# Patient Record
Sex: Female | Born: 1995 | Race: Black or African American | Hispanic: No | Marital: Single | Smoking: Never smoker
Health system: Southern US, Community
[De-identification: ages and names within clinical notes are randomized; demographics above are authoritative.]

## PROBLEM LIST (undated history)

## (undated) HISTORY — PX: BREAST BIOPSY: SHX20

## (undated) HISTORY — PX: BREAST EXCISIONAL BIOPSY: SUR124

---
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
## 2017-04-06 IMAGING — CR DG KNEE COMPLETE 4+V*L*
4 series · 4 of 4 positions shown · non-contrast
Comparison: None.

CLINICAL DATA: Status post fall from skateboard, with left knee
pain and bruising. Initial encounter.

EXAM:
LEFT KNEE - COMPLETE 4+ VIEW

[knee ap]
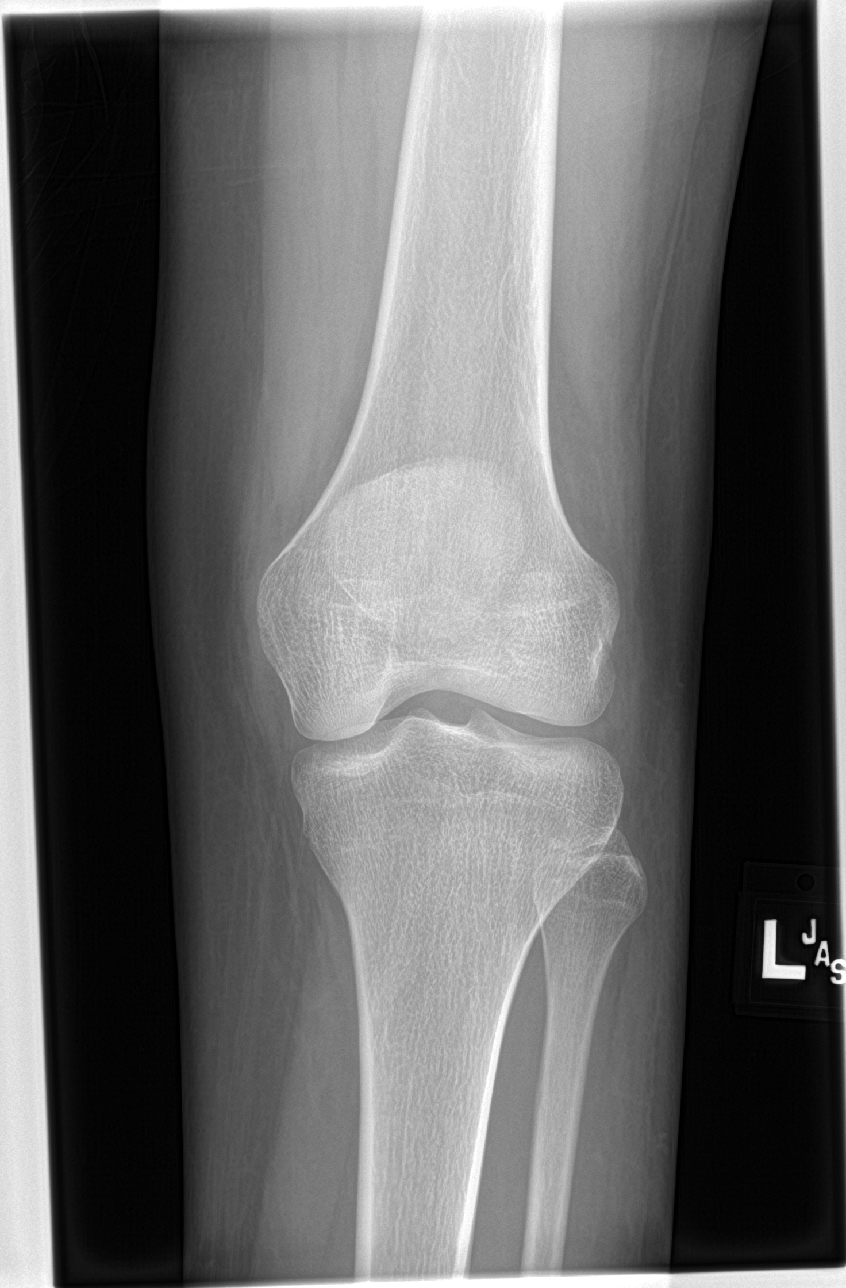

[knee lat]
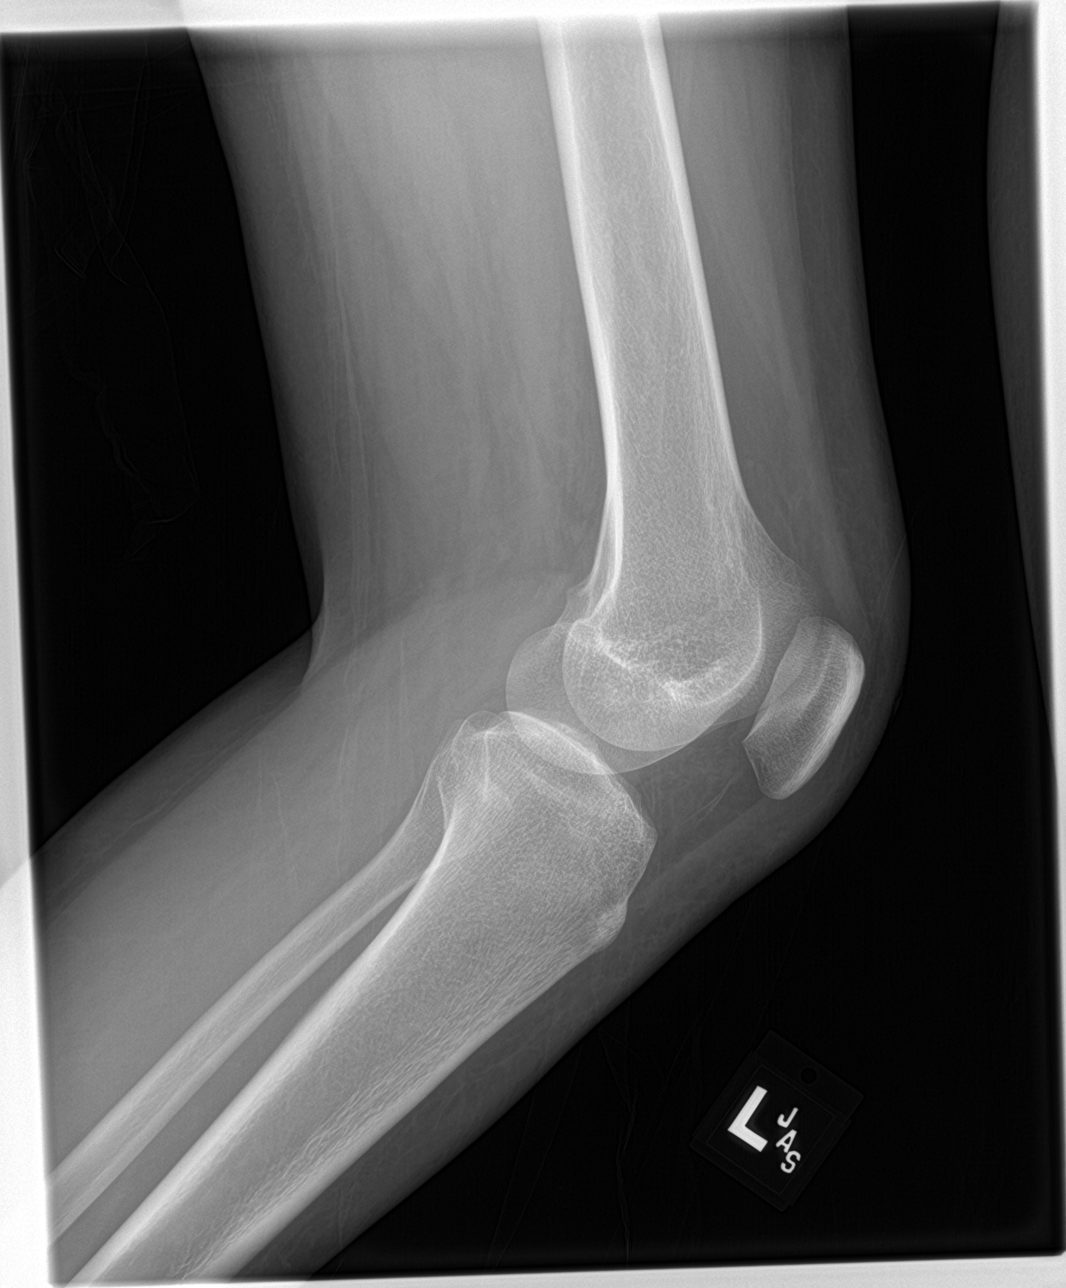

[knee obl (1 of 2)]
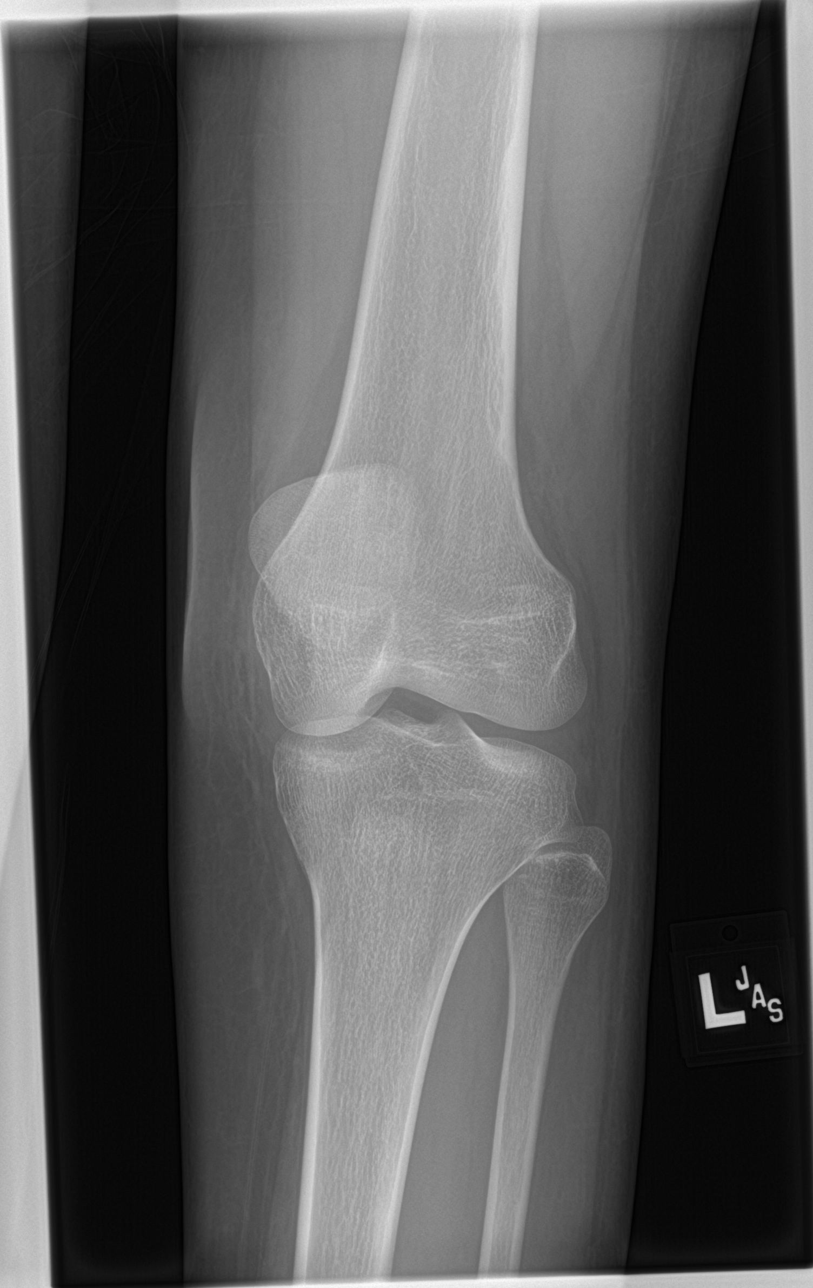

[knee obl (2 of 2)]
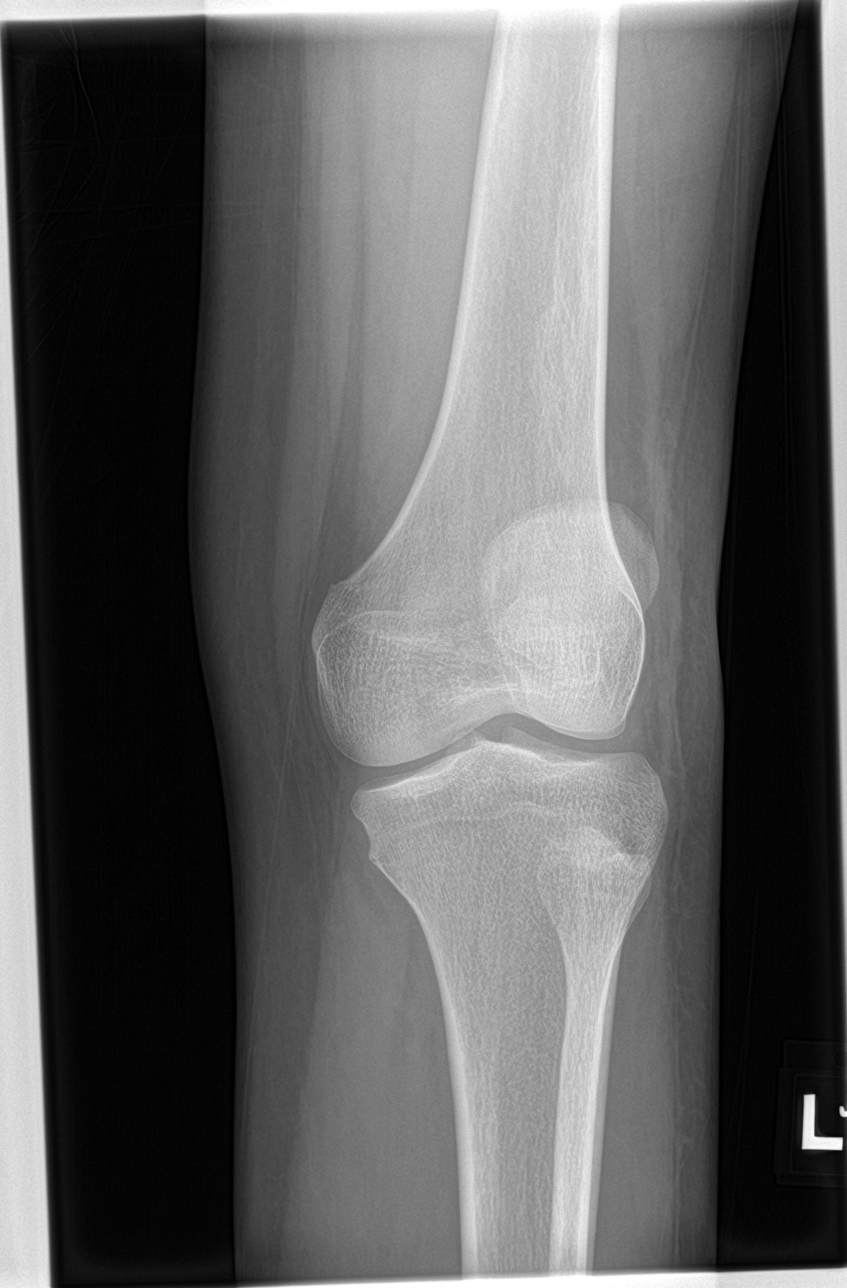

[4 of 4 positions shown; findings below may reference images not displayed]

FINDINGS: There is no evidence of fracture or dislocation. The joint spaces
are preserved. No significant degenerative change is seen; the
patellofemoral joint is grossly unremarkable in appearance.

No significant joint effusion is seen. The visualized soft tissues
are normal in appearance.
IMPRESSION: No evidence of fracture or dislocation.

## 2017-07-27 ENCOUNTER — Other Ambulatory Visit: Payer: Self-pay | Admitting: Nurse Practitioner

## 2017-07-27 DIAGNOSIS — N6320 Unspecified lump in the left breast, unspecified quadrant: Secondary | ICD-10-CM

## 2017-08-05 ENCOUNTER — Other Ambulatory Visit: Payer: BLUE CROSS/BLUE SHIELD

## 2017-08-19 ENCOUNTER — Other Ambulatory Visit: Payer: BLUE CROSS/BLUE SHIELD

## 2017-09-07 ENCOUNTER — Other Ambulatory Visit: Payer: BLUE CROSS/BLUE SHIELD

## 2017-09-07 ENCOUNTER — Ambulatory Visit
Admission: RE | Admit: 2017-09-07 | Discharge: 2017-09-07 | Disposition: A | Discharge status: Home / Self Care | Payer: BLUE CROSS/BLUE SHIELD | Source: Ambulatory Visit | Attending: Nurse Practitioner | Admitting: Nurse Practitioner

## 2017-09-07 ENCOUNTER — Other Ambulatory Visit: Payer: Self-pay | Admitting: Nurse Practitioner

## 2017-09-07 DIAGNOSIS — N6320 Unspecified lump in the left breast, unspecified quadrant: Secondary | ICD-10-CM

## 2018-03-15 ENCOUNTER — Other Ambulatory Visit: Payer: BLUE CROSS/BLUE SHIELD

## 2018-04-02 IMAGING — US ULTRASOUND LEFT BREAST LIMITED
1 series · 13 of 18 positions shown · non-contrast
Comparison: None available at this time.

ADDENDUM:
Comparison is made with previous exam performed 07/05/2015 at
[REDACTED]. In the exam, a parallel oval hypoechoic mass in
the 7 o'clock location of the left breast 6 cm from nipple measured
3.0 x 1.4 x 2.0 cm. Based on the appearance, this likely represents
the same lesion described on current study in the 530 o'clock
location of the left breast. However, given the slight differences
in described location and size, as well as the 2 additional lesions,
I would recommend a follow-up ultrasound in 6 months.

BI-RADS 3: Probably benign.
CLINICAL DATA: History of excisional biopsy of a right breast
fibroadenoma in 1074. Patient has a lump in the lower outer quadrant
of the left breast, first noticed approximately 2 years ago. Mass
feels bigger to the patient and her physician. Patient gives a
history of evaluation of this region in [place] approximately 2
years ago.
EXAM:
ULTRASOUND OF THE LEFT BREAST

[Series 1: ultrasound left breast limited · 0.06mm/px · 13 of 18 slices shown]
[im 1/18]
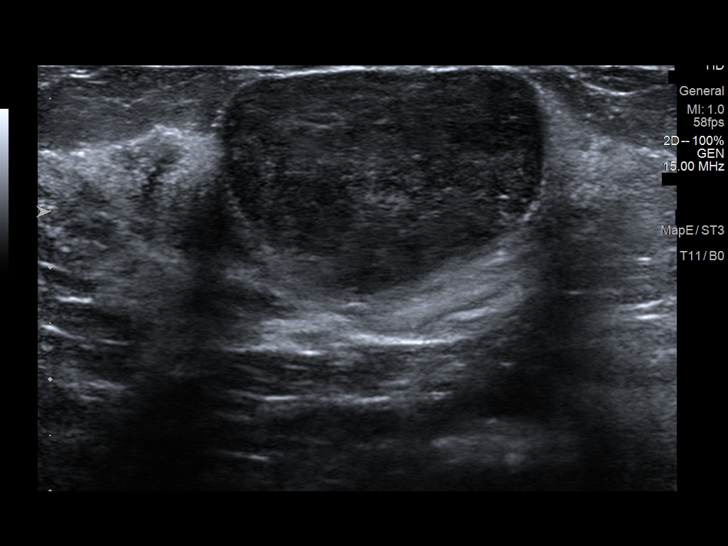
[im 3/18]
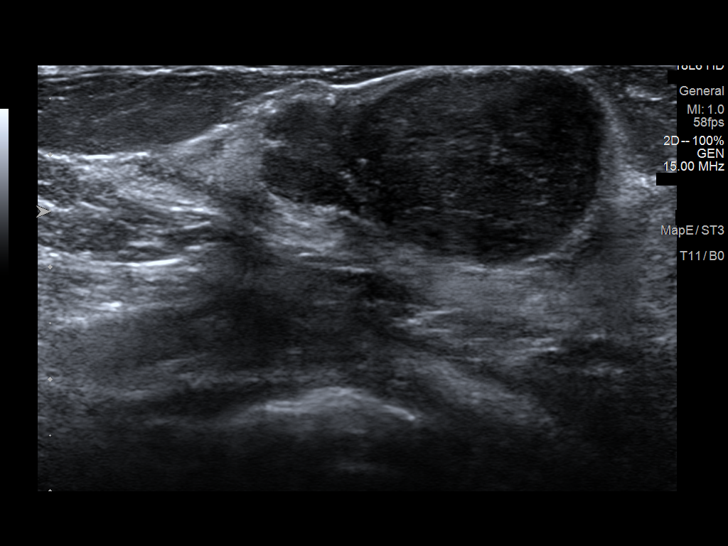
[im 4/18]
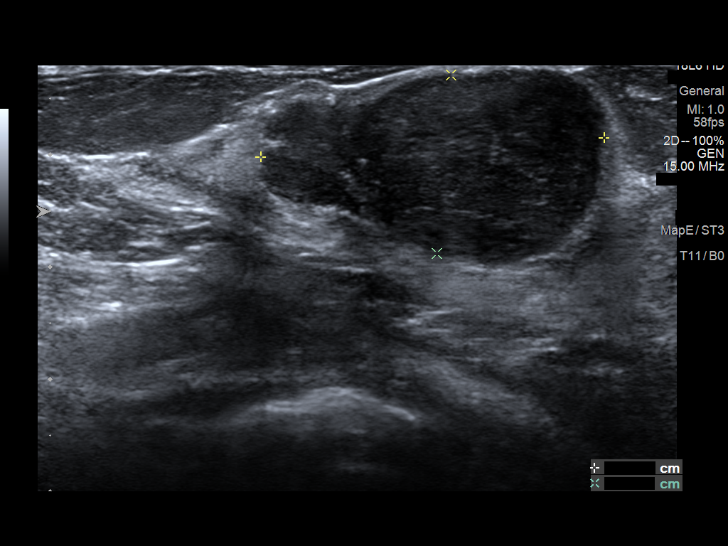
[im 5/18]
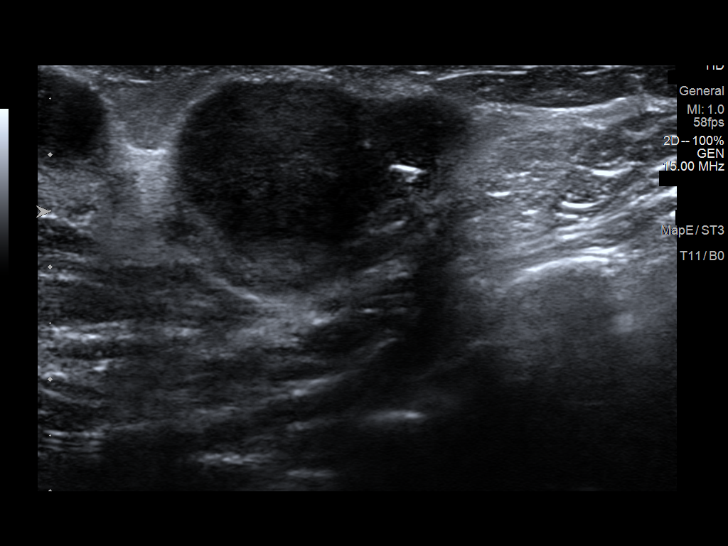
[im 7/18]
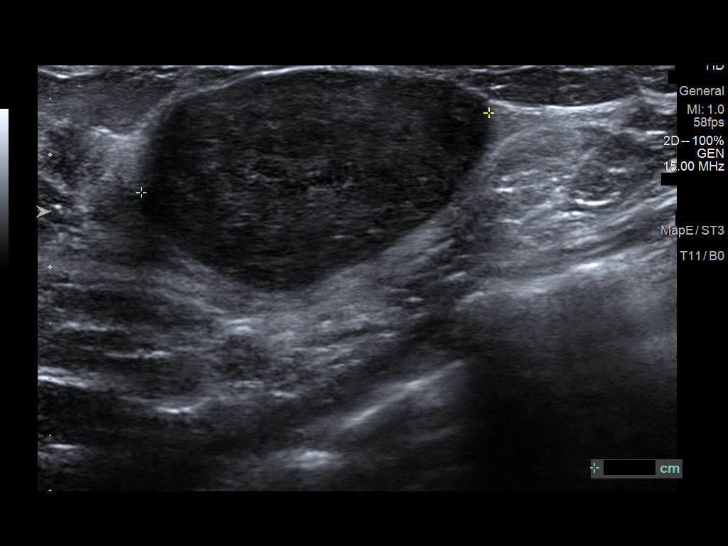
[im 8/18]
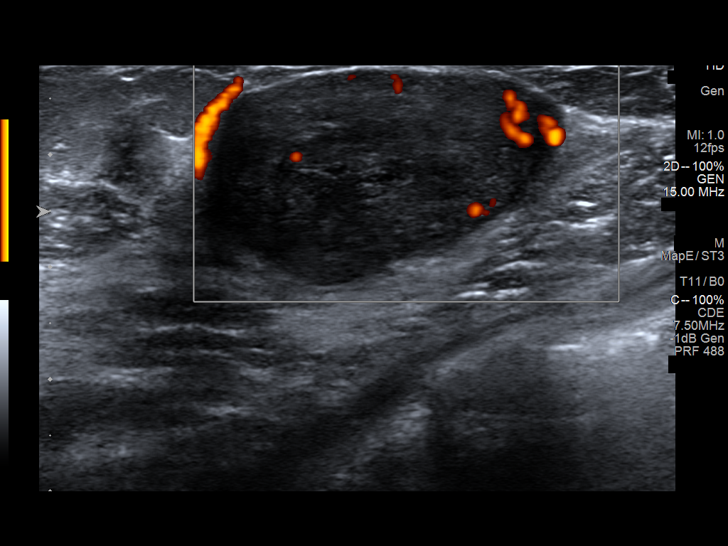
[im 10/18]
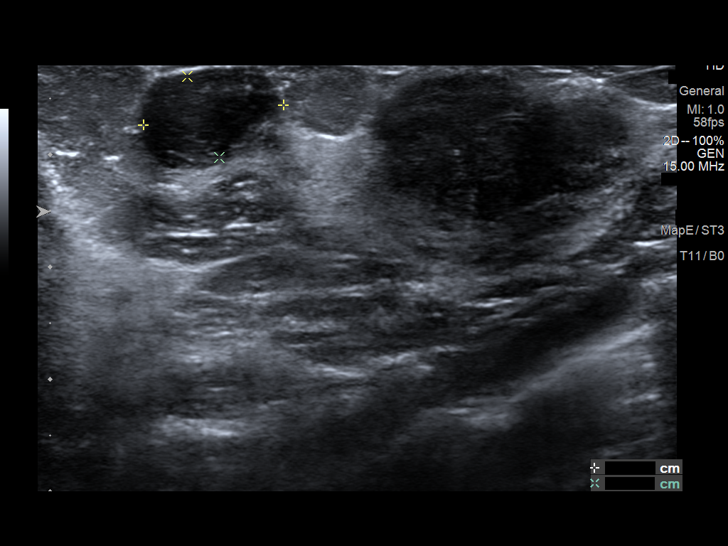
[im 11/18]
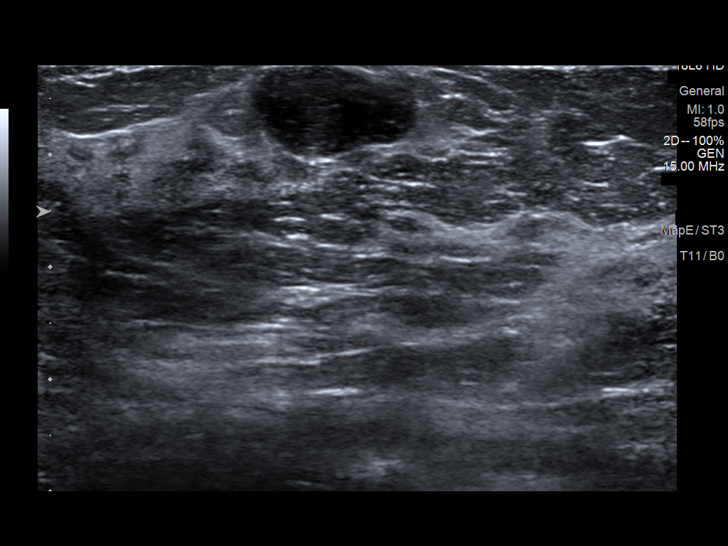
[im 12/18]
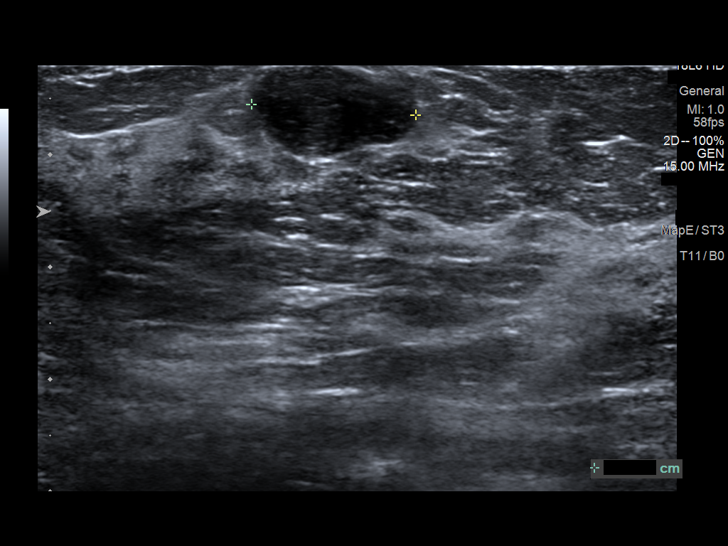
[im 14/18]
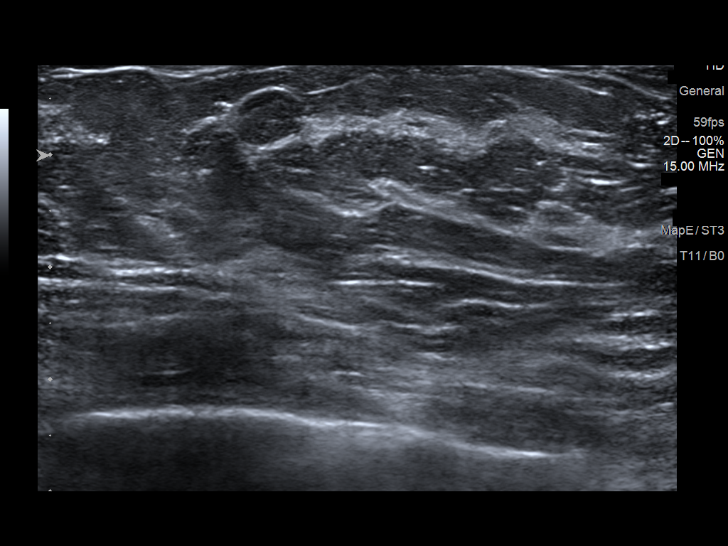
[im 15/18]
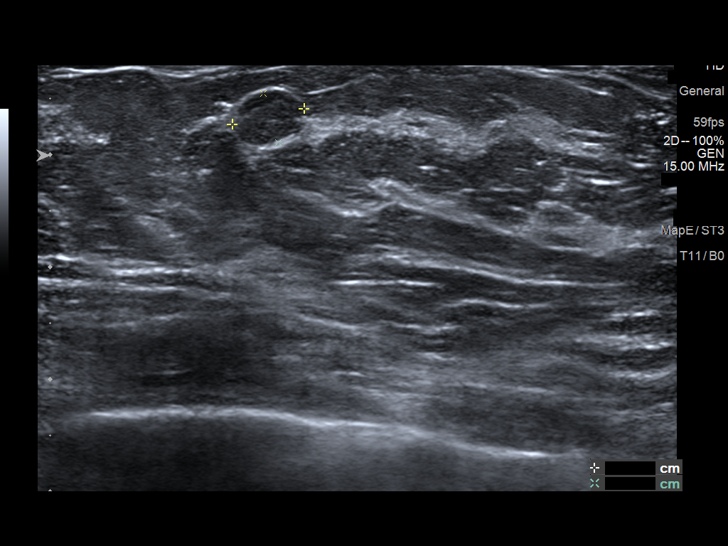
[im 16/18]
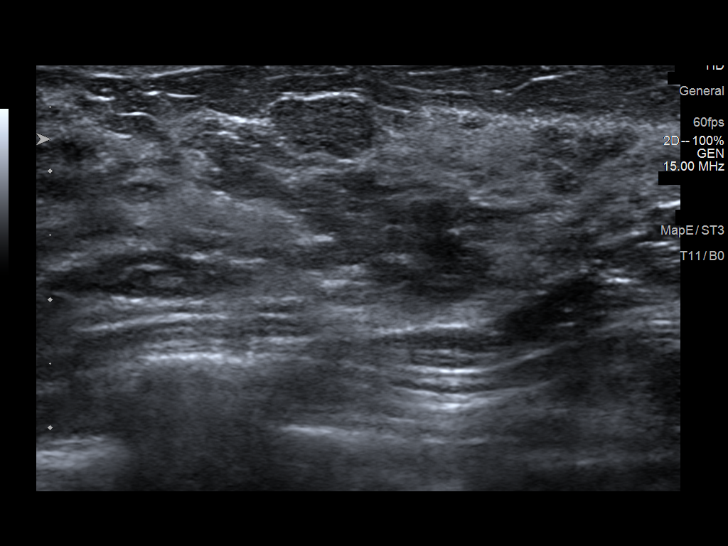
[im 18/18]
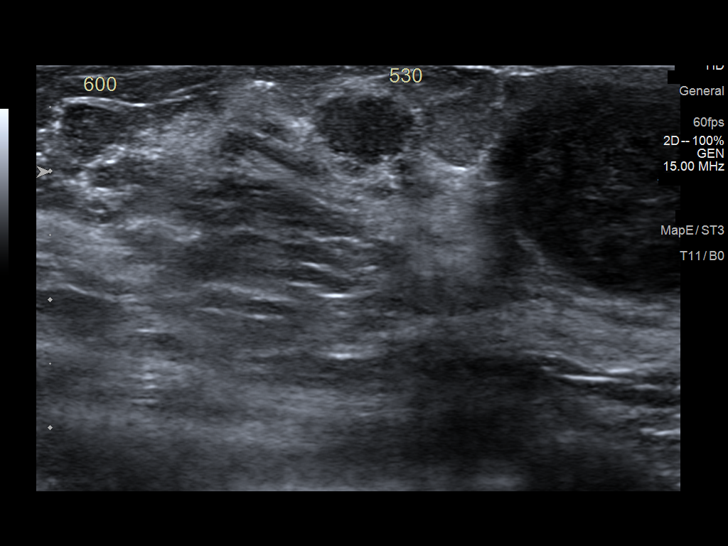

[13 of 18 positions shown; findings below may reference images not displayed]

FINDINGS: On physical exam, I palpate a rounded mobile mass in the 5-6 o'clock
location of the left breast.

Targeted ultrasound is performed, showing an oval parallel
hypoechoic circumscribed mass in the 530 o'clock location of the
left breast 4 cm from the nipple measuring 3.1 x 1.6 x 3.2 cm. There
is associated posterior acoustic enhancement and internal blood
flow. Findings are consistent with benign fibroadenoma. Within the
mass, there is a hyperechoic focus, possibly representing a tissue
marker clip.

In the 530 o'clock location 4 cm from nipple, a similar oval
parallel hypoechoic mass is 1.3 x 0.8 x 1.5 cm. In the 6 o'clock
location 2 cm from nipple, a similar mass is 0.7 x 0.5 x 0.8 cm.
These 2 smaller lesions are not palpable on exam.
IMPRESSION: 1. Palpable abnormality in the 530 o'clock location of the left
breast is compatible with benign fibroadenoma, possibly previously
biopsied. Comparison with previous exams is recommended to assess
stability.
2. 2 smaller probable fibroadenomas in the 530 and 6 o'clock
locations. Comparison with prior exams is recommended to assess
stability.

RECOMMENDATION:
Prior exams will be obtained to assess stability. If previous exams
are not obtained, I would recommend a follow-up left breast
ultrasound in 6 months.

I have discussed the findings and recommendations with the patient
and her stepfather, at her request. Results were also provided in
writing at the conclusion of the visit. If applicable, a reminder
letter will be sent to the patient regarding the next appointment.

BI-RADS CATEGORY  0: Incomplete. Need additional imaging evaluation
and/or prior mammograms for comparison.
